# Patient Record
Sex: Male | Born: 1980 | Race: Black or African American | Hispanic: Yes | Marital: Single
Health system: Southern US, Community
[De-identification: ages and names within clinical notes are randomized; demographics above are authoritative.]

---

## 2005-08-27 ENCOUNTER — Emergency Department (HOSPITAL_COMMUNITY): Admission: EM | Admit: 2005-08-27 | Discharge: 2005-08-27 | Payer: Self-pay | Admitting: Family Medicine

## 2009-05-16 ENCOUNTER — Emergency Department (HOSPITAL_COMMUNITY): Admission: EM | Admit: 2009-05-16 | Discharge: 2009-05-17 | Payer: Self-pay | Admitting: Emergency Medicine

## 2009-06-28 ENCOUNTER — Emergency Department (HOSPITAL_COMMUNITY): Admission: EM | Admit: 2009-06-28 | Discharge: 2009-06-28 | Payer: Self-pay | Admitting: Emergency Medicine

## 2010-02-16 IMAGING — CR DG ANKLE COMPLETE 3+V*L*
3 series · 3 of 3 positions shown · non-contrast
Comparison: None

CLINICAL DATA: Left ankle pain and swelling.

LEFT ANKLE COMPLETE - 3+ VIEW

[t ankle joint ap left]
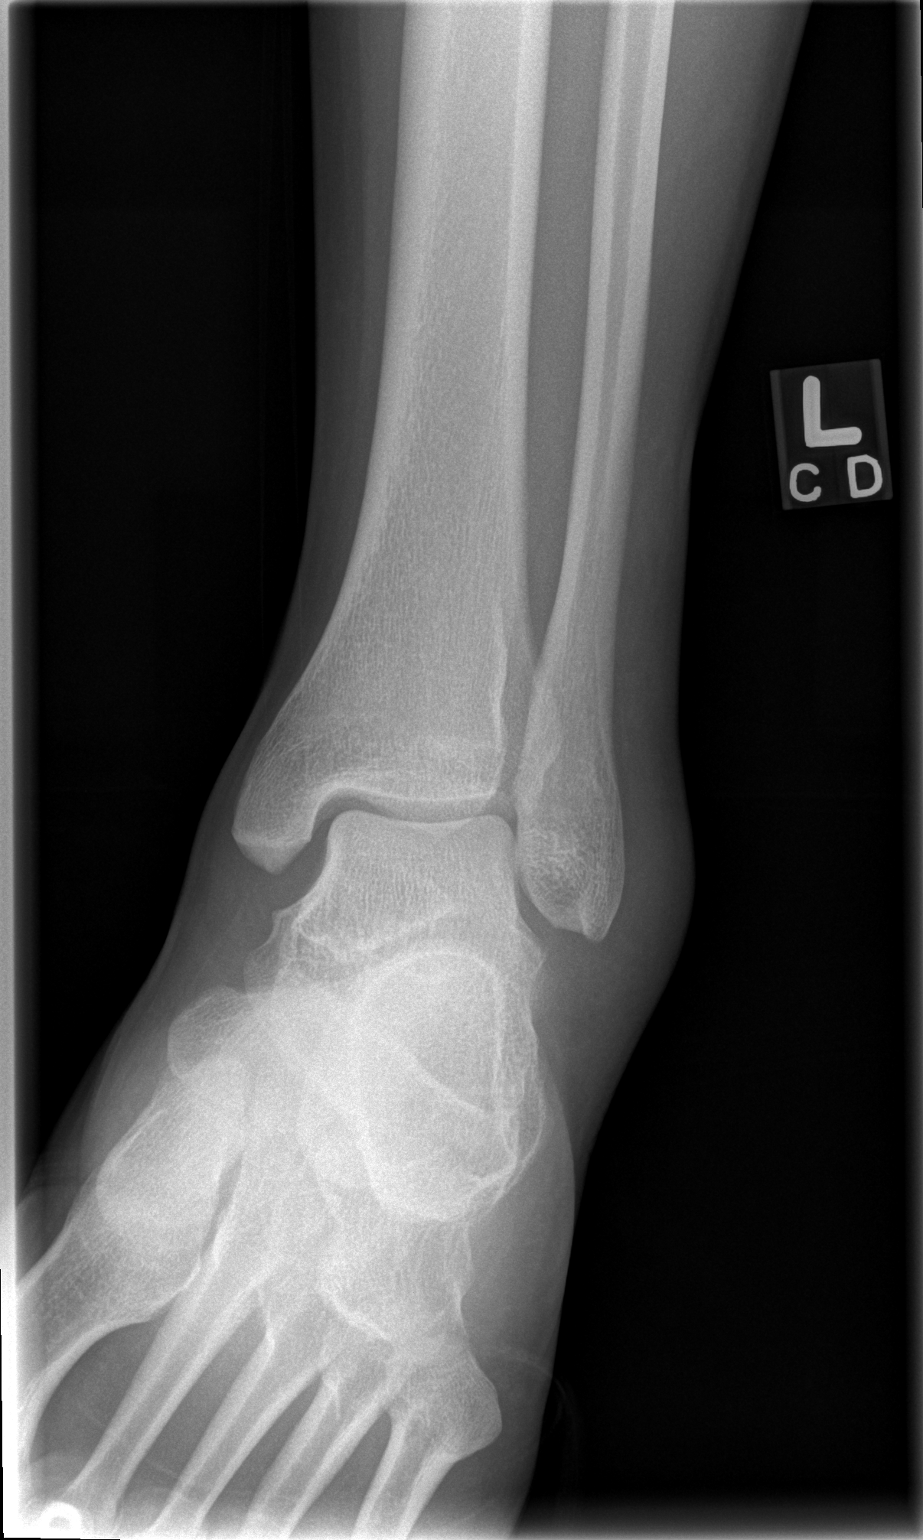

[t ankle joint oblique left]
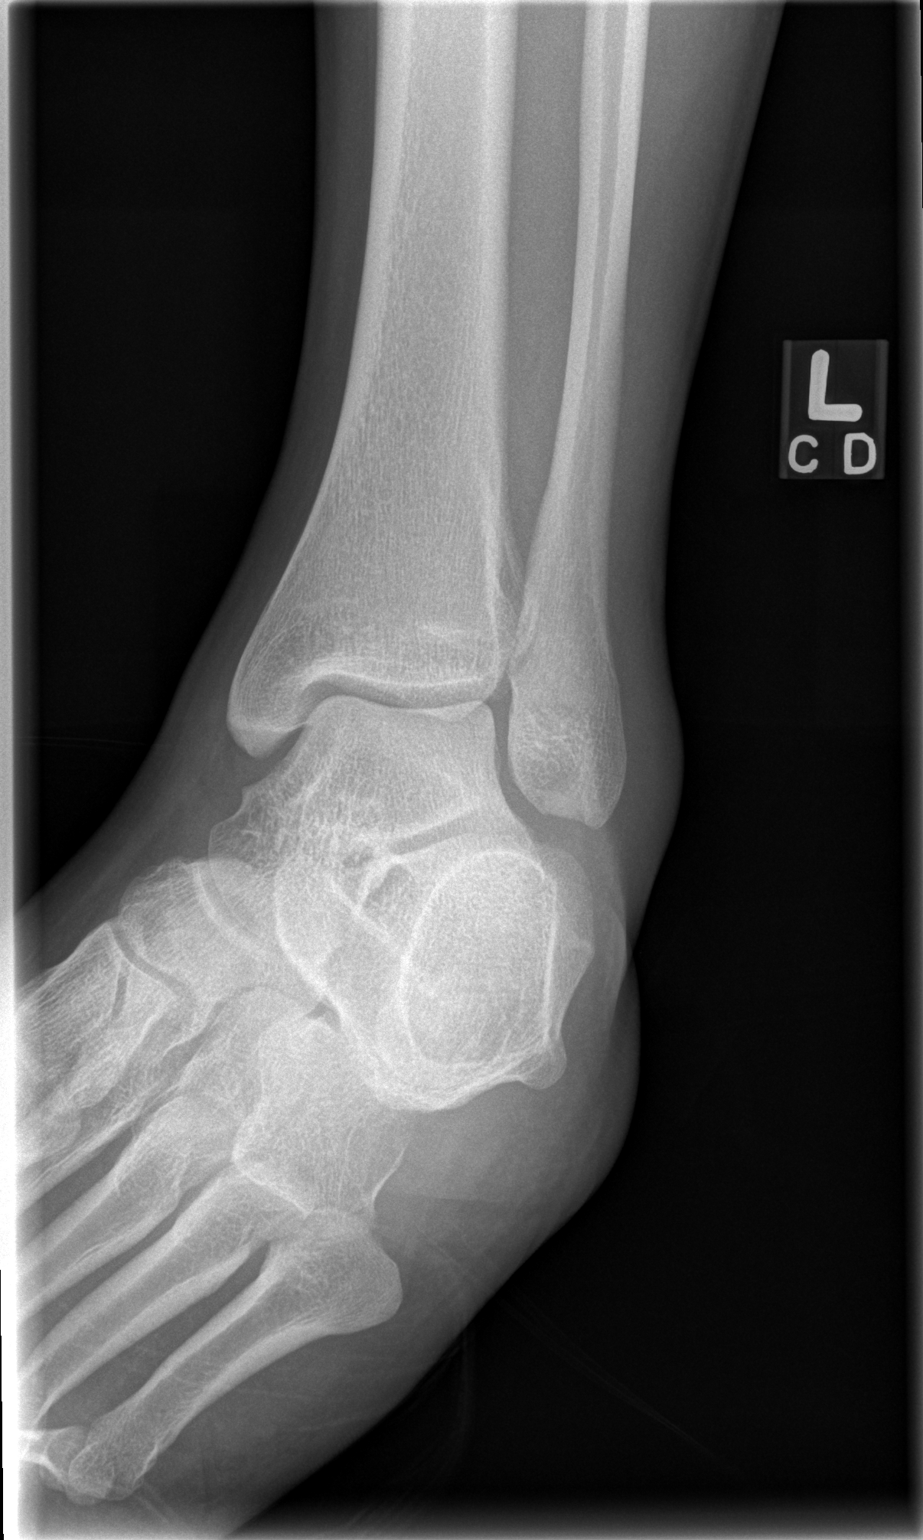

[t ankle joint lat left]
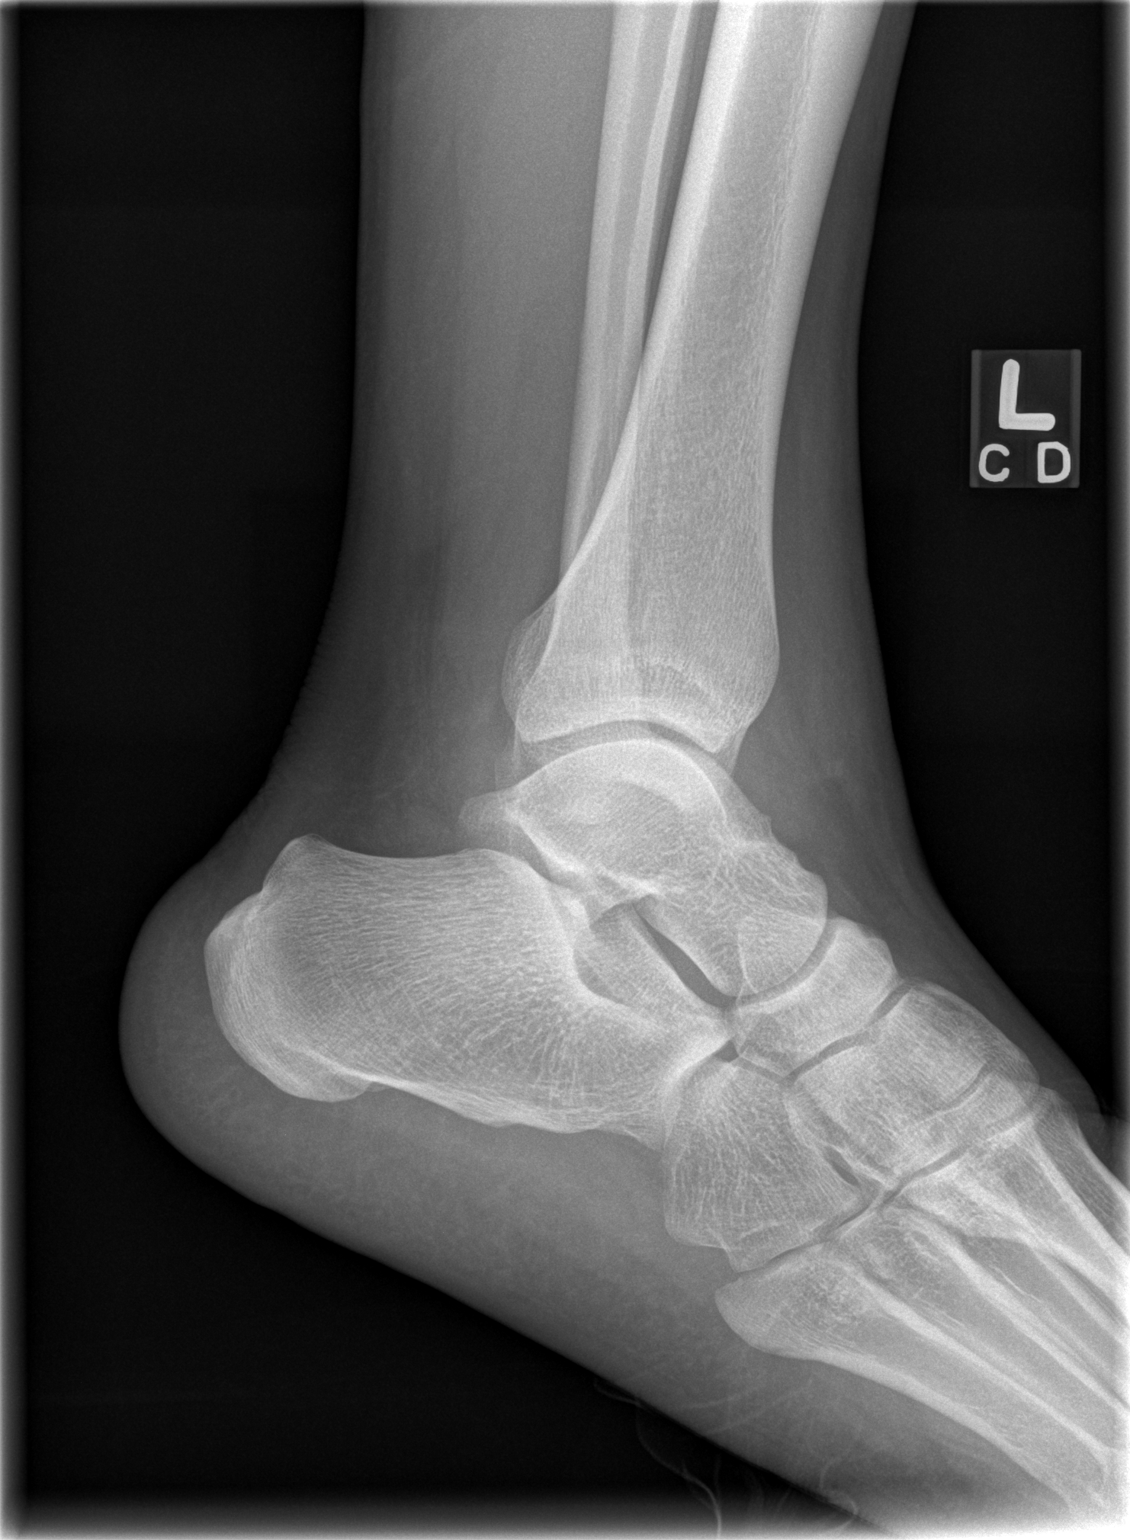

[3 of 3 positions shown; findings below may reference images not displayed]

FINDINGS: Lateral soft tissue swelling is identified.
There is no evidence of fracture, subluxation, dislocation.
The ankle mortise is intact.
IMPRESSION: Soft tissue swelling without acute bony abnormality.

## 2019-11-28 ENCOUNTER — Emergency Department (HOSPITAL_COMMUNITY)
Admission: EM | Admit: 2019-11-28 | Discharge: 2019-11-28 | Disposition: A | Discharge status: Home / Self Care | Payer: BLUE CROSS/BLUE SHIELD | Attending: Emergency Medicine | Admitting: Emergency Medicine

## 2019-11-28 ENCOUNTER — Other Ambulatory Visit: Payer: Self-pay

## 2019-11-28 ENCOUNTER — Encounter (HOSPITAL_COMMUNITY): Payer: Self-pay | Admitting: Emergency Medicine

## 2019-11-28 DIAGNOSIS — H109 Unspecified conjunctivitis: Secondary | ICD-10-CM | POA: Diagnosis not present

## 2019-11-28 DIAGNOSIS — H5711 Ocular pain, right eye: Secondary | ICD-10-CM | POA: Diagnosis present

## 2019-11-28 MED ORDER — FLUORESCEIN SODIUM 1 MG OP STRP
1.0000 | ORAL_STRIP | Freq: Once | OPHTHALMIC | Status: AC
  Administered 2019-11-28: 1 via OPHTHALMIC
  Filled 2019-11-28: qty 1

## 2019-11-28 MED ORDER — POLYMYXIN B-TRIMETHOPRIM 10000-0.1 UNIT/ML-% OP SOLN: 1.0000 [drp] | Freq: Four times a day (QID) | OPHTHALMIC | 0 refills | Status: AC

## 2019-11-28 MED ORDER — TETRACAINE HCL 0.5 % OP SOLN
2.0000 [drp] | Freq: Once | OPHTHALMIC | Status: AC
  Administered 2019-11-28: 2 [drp] via OPHTHALMIC
  Filled 2019-11-28: qty 4

## 2019-11-28 MED ORDER — ACETAMINOPHEN 325 MG PO TABS
650.0000 mg | ORAL_TABLET | Freq: Once | ORAL | Status: AC
  Administered 2019-11-28: 650 mg via ORAL
  Filled 2019-11-28: qty 2

## 2019-11-28 NOTE — Discharge Instructions (Signed)
Today you were evaluated at the emergency department for red eye.  Conjunctivitis is very contagious. Try to avoid rubbing eyes. Apply warm compresses and use prescribed eye drops as directed. Follow up with primary care provider in 1 week for further evaluation and to ensure improvement of symptoms. Return to ER for new or worsening symptoms.   Conjunctivitis   Conjunctivitis is commonly called "pink eye." Conjunctivitis can be caused by bacterial or viral infection, allergies, or injuries. There is usually redness of the lining of the eye, itching, discomfort, and sometimes discharge. Pink eye is very contagious and spreads by direct contact.  You may be given antibiotic eyedrops as part of your treatment. Before using your eye medicine, remove all drainage from the eye by washing gently with warm water and cotton balls. Continue to use the medication until you have awakened 2 mornings in a row without discharge from the eye. Do not rub your eye. This increases the irritation and helps spread infection. Use separate towels from other household members. Wash your hands with soap and water before and after touching your eyes. Use cold compresses to reduce pain and sunglasses to relieve irritation from light.   SEEK MEDICAL CARE IF:  Your symptoms are not better after 3 days of treatment.  You have increased pain or trouble seeing.  The outer eyelids become very red or swollen.  You develop double vision or your vision becomes blurred or worsens in any way.  You have trouble moving your eyes.  The eye looks like it is popping out (proptosis).  You develop a severe headache, severe neck pain, or neck stiffness.  You develop repeated vomiting.  You have a fever or persistent symptoms for more than 72 hours.  You have a fever and your symptoms suddenly get worse.

## 2019-11-28 NOTE — ED Provider Notes (Signed)
[facility] DEPT Provider Note   CSN: [id number] Arrival date & time: 11/28/19  1016     History Chief Complaint  Patient presents with  . Eye Pain    Timothy Pugh is a 39 y.o. male with no known past medical history present to emergency department with chief complaint of right eye pain x 4 days. Patient states he first noticed his right eye was red. He has then noticed it was draining and he felt like his vision was blurry. He also endorses mild headache and nasal congestion stating it feels like headaches he has had in the past. He denies any associated neck pain. He has been taking ibuprofen with minimal symptom relief. He states this morning his right eye was matted shut with yellow drainage. No foreign body sensation or known injury to eye. Patient does not wear contacts or glasses.  He denies fever, chills, sore throat, cough, shortness of breath, chest pain, abdominal pain, nausea, vomiting, urinary symptoms, diarrhea.   History reviewed. No pertinent past medical history.  There are no problems to display for this patient.   History reviewed. No pertinent surgical history.     No family history on file.  Social History   Tobacco Use  . Smoking status: Not on file  Substance Use Topics  . Alcohol use: Not on file  . Drug use: Not on file    Home Medications Prior to Admission medications   Medication Sig Start Date End Date Taking? Authorizing Provider  trimethoprim-polymyxin b (POLYTRIM) ophthalmic solution Place 1 drop into the right eye every 6 (six) hours for 7 days. 11/28/19 12/05/19  Abeni Finchum, Harley Hallmark, PA-C    Allergies    Patient has no allergy information on record.  Review of Systems   Review of Systems All other systems are reviewed and are negative for acute change except as noted in the HPI.  Physical Exam Updated Vital Signs BP (!) 153/100 (BP Location: Right Arm)   Pulse (!) 55   Temp 98.1 F (36.7 C)  (Oral)   Resp 20   SpO2 100%   Physical Exam Vitals and nursing note reviewed.  Constitutional:      Appearance: He is well-developed. He is not ill-appearing or toxic-appearing.  HENT:     Head: Normocephalic and atraumatic.     Nose: Nose normal.  Eyes:     General: Lids are normal. Lids are everted, no foreign bodies appreciated. Vision grossly intact. Gaze aligned appropriately. No scleral icterus.       Right eye: Discharge present. No foreign body.        Left eye: No discharge.     Intraocular pressure: Right eye pressure is 14 mmHg. Left eye pressure is 16 mmHg. Measurements were taken using a handheld tonometer.    Extraocular Movements: Extraocular movements intact.     Conjunctiva/sclera:     Right eye: Right conjunctiva is injected. No chemosis, exudate or hemorrhage.    Left eye: Left conjunctiva is not injected. No chemosis, exudate or hemorrhage.    Pupils: Pupils are equal, round, and reactive to light.     Right eye: Pupil is not sluggish.     Left eye: Pupil is not sluggish.     Visual Fields: Right eye visual fields normal and left eye visual fields normal.     Comments: Tetracaine and Fluorescein applied to both eyes.  Evaluation by Sherral Hammers lamp revealed no evidence of corneal abrasion/fluorescein uptake.  No dendritic lesions.  Negative Seidel sign.   Neck:     Vascular: No JVD.  Cardiovascular:     Rate and Rhythm: Normal rate and regular rhythm.     Pulses: Normal pulses.     Heart sounds: Normal heart sounds.  Pulmonary:     Effort: Pulmonary effort is normal.     Breath sounds: Normal breath sounds.  Abdominal:     General: There is no distension.  Musculoskeletal:        General: Normal range of motion.     Cervical back: Normal range of motion.  Skin:    General: Skin is warm and dry.  Neurological:     Mental Status: He is oriented to person, place, and time.     GCS: GCS eye subscore is 4. GCS verbal subscore is 5. GCS motor subscore is 6.      Comments: Fluent speech, no facial droop.  Psychiatric:        Behavior: Behavior normal.     ED Results / Procedures / Treatments   Labs (all labs ordered are listed, but only abnormal results are displayed) Labs Reviewed - No data to display  EKG None  Radiology No results found.  Procedures Procedures (including critical care time)  Medications Ordered in ED Medications  fluorescein ophthalmic strip 1 strip (1 strip Both Eyes Given by Other 11/28/19 1210)  tetracaine (PONTOCAINE) 0.5 % ophthalmic solution 2 drop (2 drops Both Eyes Given by Other 11/28/19 1211)  acetaminophen (TYLENOL) tablet 650 mg (650 mg Oral Given 11/28/19 1130)    ED Course  I have reviewed the triage vital signs and the nursing notes.  Pertinent labs & imaging results that were available during my care of the patient were reviewed by me and considered in my medical decision making (see chart for details).    Visual Acuity  Right Eye Distance: 20/100 Left Eye Distance: 20/25 Bilateral Distance: 20/25  Right Eye Near:   Left Eye Near:    Bilateral Near:        MDM Rules/Calculators/A&P                       Patient seen and examined. Patient presents awake, alert, hemodynamically stable, afebrile, non toxic.  Patient presentation consistent with viral vs bacterial conjunctivitis.  No purulent discharge, corneal abrasions, entrapment, consensual photophobia, or dendritic staining with fluorescein study. He is not contact lens wearer.  Presentation non-concerning for iritis, corneal abrasions, or HSV.  Will prescribe antibiotic drops to cover for possible bacterial infection.  Personal hygiene and frequent handwashing discussed.  Patient advised to followup with ophthalmologist if symptoms persist or worsen in any way including vision change or purulent discharge.  Patient verbalizes understanding and is agreeable with discharge.   Portions of this note were generated with [employer]. Dictation errors may occur despite best attempts at proofreading.  Final Clinical Impression(s) / ED Diagnoses Final diagnoses:  Conjunctivitis of right eye, unspecified conjunctivitis type    Rx / DC Orders ED Discharge Orders         Ordered    trimethoprim-polymyxin b (POLYTRIM) ophthalmic solution  Every 6 hours     11/28/19 1137           Noemi Bellissimo, Mliss Sax 11/28/19 1231    Raeford Razor, MD 12/01/19 [phone]

## 2019-11-28 NOTE — ED Triage Notes (Signed)
Per pt, states right eye swollen, red and draining-states he thought it was due to allergies-

## 2020-07-12 DEATH — deceased
